# Patient Record
Sex: Male | Born: 2000 | Race: Black or African American | Hispanic: No | Marital: Single | Smoking: Never smoker
Health system: Southern US, Community
[De-identification: ages and names within clinical notes are randomized; demographics above are authoritative.]

---
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
## 2021-06-02 DIAGNOSIS — Z114 Encounter for screening for human immunodeficiency virus [HIV]: Secondary | ICD-10-CM | POA: Diagnosis not present

## 2021-06-02 DIAGNOSIS — Z113 Encounter for screening for infections with a predominantly sexual mode of transmission: Secondary | ICD-10-CM | POA: Diagnosis not present

## 2021-09-13 ENCOUNTER — Ambulatory Visit (HOSPITAL_COMMUNITY)
Admission: EM | Admit: 2021-09-13 | Discharge: 2021-09-13 | Disposition: A | Discharge status: Home / Self Care | Payer: BC Managed Care – PPO | Attending: Emergency Medicine | Admitting: Emergency Medicine

## 2021-09-13 ENCOUNTER — Encounter (HOSPITAL_COMMUNITY): Payer: Self-pay | Admitting: Emergency Medicine

## 2021-09-13 DIAGNOSIS — M546 Pain in thoracic spine: Secondary | ICD-10-CM | POA: Diagnosis not present

## 2021-09-13 MED ORDER — IBUPROFEN 800 MG PO TABS: 800.0000 mg | ORAL_TABLET | Freq: Three times a day (TID) | ORAL | 0 refills | Status: AC

## 2021-09-13 MED ORDER — CYCLOBENZAPRINE HCL 5 MG PO TABS: 5.0000 mg | ORAL_TABLET | Freq: Every day | ORAL | 0 refills | Status: AC

## 2021-09-13 NOTE — Discharge Instructions (Signed)
Your pain is most likely caused by irritation to the muscles or ligaments.   Take ibuprofen three times a day( every 8 hours)  May use muscle relaxer as needed at bedtime, be mindful this may make you drowsy   You may use heating pad in 15 minute intervals as needed for additional comfort, within the first 2-3 days you may find comfort in using ice in 10-15 minutes over affected area  Begin stretching affected area daily for 10 minutes as tolerated to further loosen muscles   When lying down place pillow underneath and between knees for support  Can try sleeping without pillow on firm mattress   Practice good posture: head back, shoulders back, chest forward, pelvis back and weight distributed evenly on both legs  If pain persist after recommended treatment or reoccurs if may be beneficial to follow up with orthopedic specialist for evaluation, this doctor specializes in the bones and can manage your symptoms long-term with options such as but not limited to imaging, medications or physical therapy
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
## 2022-01-06 DIAGNOSIS — Z114 Encounter for screening for human immunodeficiency virus [HIV]: Secondary | ICD-10-CM | POA: Diagnosis not present

## 2022-01-06 DIAGNOSIS — Z113 Encounter for screening for infections with a predominantly sexual mode of transmission: Secondary | ICD-10-CM | POA: Diagnosis not present

## 2023-02-15 ENCOUNTER — Encounter: Payer: Self-pay | Admitting: Obstetrics & Gynecology
